# Patient Record
Sex: Female | Born: 1985 | Hispanic: No | Marital: Single | Smoking: Never smoker
Health system: Southern US, Community
[De-identification: ages and names within clinical notes are randomized; demographics above are authoritative.]

---

## 2019-09-23 ENCOUNTER — Other Ambulatory Visit: Payer: Self-pay

## 2019-09-23 ENCOUNTER — Emergency Department (HOSPITAL_COMMUNITY)
Admission: EM | Admit: 2019-09-23 | Discharge: 2019-09-23 | Disposition: A | Discharge status: Home / Self Care | Payer: Self-pay | Attending: Emergency Medicine | Admitting: Emergency Medicine

## 2019-09-23 ENCOUNTER — Emergency Department (HOSPITAL_COMMUNITY): Payer: Self-pay

## 2019-09-23 DIAGNOSIS — S60211A Contusion of right wrist, initial encounter: Secondary | ICD-10-CM | POA: Insufficient documentation

## 2019-09-23 DIAGNOSIS — S60212A Contusion of left wrist, initial encounter: Secondary | ICD-10-CM | POA: Insufficient documentation

## 2019-09-23 DIAGNOSIS — S60812A Abrasion of left wrist, initial encounter: Secondary | ICD-10-CM | POA: Insufficient documentation
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
## 2019-09-23 NOTE — Discharge Instructions (Signed)
Your x-rays were negative for fracture.  Rest ice and elevate both your wrist.  Use Tylenol and ibuprofen as discussed below. These will take some time to heal.  Please read the attached information.  Please use Tylenol or ibuprofen for pain.  You may use 600 mg ibuprofen every 6 hours or 1000 mg of Tylenol every 6 hours.  You may choose to alternate between the 2.  This would be most effective.  Not to exceed 4 g of Tylenol within 24 hours.  Not to exceed 3200 mg ibuprofen 24 hours.

## 2019-11-14 ENCOUNTER — Emergency Department (HOSPITAL_COMMUNITY)
Admission: EM | Admit: 2019-11-14 | Discharge: 2019-11-14 | Disposition: A | Discharge status: Home / Self Care | Payer: Self-pay | Attending: Emergency Medicine | Admitting: Emergency Medicine

## 2019-11-14 ENCOUNTER — Encounter (HOSPITAL_COMMUNITY): Payer: Self-pay | Admitting: Emergency Medicine

## 2019-11-14 ENCOUNTER — Other Ambulatory Visit: Payer: Self-pay

## 2019-11-14 DIAGNOSIS — K0889 Other specified disorders of teeth and supporting structures: Secondary | ICD-10-CM | POA: Insufficient documentation

## 2019-11-14 MED ORDER — AMOXICILLIN 500 MG PO CAPS
500.0000 mg | ORAL_CAPSULE | Freq: Once | ORAL | Status: AC
  Administered 2019-11-14: 500 mg via ORAL
  Filled 2019-11-14: qty 1

## 2019-11-14 MED ORDER — NAPROXEN 250 MG PO TABS
375.0000 mg | ORAL_TABLET | Freq: Once | ORAL | Status: AC
  Administered 2019-11-14: 375 mg via ORAL
  Filled 2019-11-14: qty 2

## 2019-11-14 MED ORDER — AMOXICILLIN 500 MG PO CAPS: 500.0000 mg | ORAL_CAPSULE | Freq: Two times a day (BID) | ORAL | 0 refills | Status: AC

## 2019-11-14 MED ORDER — AMOXICILLIN-POT CLAVULANATE 875-125 MG PO TABS: 1.0000 | ORAL_TABLET | Freq: Once | ORAL | Status: DC

## 2019-11-14 NOTE — ED Triage Notes (Signed)
C/o R upper and lower dental pain x 1 week.

## 2019-11-14 NOTE — ED Provider Notes (Signed)
[facility] EMERGENCY DEPARTMENT Provider Note   CSN: [id number] Arrival date & time: 11/14/19  1708     History Chief Complaint  Patient presents with  . Dental Pain    Christie Stevens is a 34 y.o. female who presents for evaluation of right-sided dental pain that has been ongoing for about a week.  She states that she has had a history of dental issues but does not currently have a dentist.  She feels like over the last week, the pain has started in both upper and lower part of her teeth.  She states that over the last day, is gotten worse.  She feels like the pain radiates up to her side.  She feels like her face might have been a little swollen.  She has not any fevers, nausea/vomiting, difficulty breathing.  She has been taking over-the-counter pain medications.  The history is provided by the patient.       History reviewed. No pertinent past medical history.  There are no problems to display for this patient.   History reviewed. No pertinent surgical history.   OB History   No obstetric history on file.     No family history on file.  Social History   Tobacco Use  . Smoking status: Never Smoker  . Smokeless tobacco: Never Used  Substance Use Topics  . Alcohol use: Not Currently  . Drug use: Not Currently    Home Medications Prior to Admission medications   Medication Sig Start Date End Date Taking? Authorizing Provider  amoxicillin (AMOXIL) 500 MG capsule Take 1 capsule (500 mg total) by mouth 2 (two) times daily for 7 days. 11/14/19 11/21/19  Maxwell Caul, PA-C    Allergies    Patient has no known allergies.  Review of Systems   Review of Systems  Constitutional: Negative for fever.  HENT: Positive for dental problem. Negative for trouble swallowing.   Respiratory: Negative for shortness of breath.   Gastrointestinal: Negative for vomiting.  All other systems reviewed and are negative.   Physical Exam Updated Vital  Signs BP 133/67 (BP Location: Right Arm)   Pulse 63   Temp 98.2 F (36.8 C) (Oral)   Resp 18   LMP 10/24/2019   SpO2 98%   Physical Exam Vitals and nursing note reviewed.  Constitutional:      Appearance: She is well-developed.  HENT:     Head: Normocephalic and atraumatic.     Comments: Face is symmetric in appearance without any overlying warmth, erythema, edema.    Mouth/Throat:     Dentition: Abnormal dentition.     Comments: Posterior oropharynx is clear without any signs of erythema, edema.  She has diffuse dental caries noted.  She has a partially cracked right upper molar at approximately tooth #2.  No surrounding gingival erythema, edema.  No identifiable dental abscess.  Uvula is midline.  Airways patent, phonation is intact. Eyes:     General: No scleral icterus.       Right eye: No discharge.        Left eye: No discharge.     Conjunctiva/sclera: Conjunctivae normal.  Pulmonary:     Effort: Pulmonary effort is normal.  Skin:    General: Skin is warm and dry.  Neurological:     Mental Status: She is alert.  Psychiatric:        Speech: Speech normal.        Behavior: Behavior normal.     ED  Results / Procedures / Treatments   Labs (all labs ordered are listed, but only abnormal results are displayed) Labs Reviewed - No data to display  EKG None  Radiology No results found.  Procedures Procedures (including critical care time)  Medications Ordered in ED Medications  amoxicillin (AMOXIL) capsule 500 mg (500 mg Oral Given 11/14/19 1900)  naproxen (NAPROSYN) tablet 375 mg (375 mg Oral Given 11/14/19 1900)    ED Course  I have reviewed the triage vital signs and the nursing notes.  Pertinent labs & imaging results that were available during my care of the patient were reviewed by me and considered in my medical decision making (see chart for details).    MDM Rules/Calculators/A&P                          34 y.o. F presents with 1 week of dental  pain. No evidence of abscess requiring immediate incision and drainage. Exam not concerning for Ludwig's angina or pharyngeal abscess. Will treat with amoxicillin. Patient instructed to follow-up with dentist referral provided. Stable for discharge at this time. Strict return precautions discussed. Patient expresses understanding and agreement to plan.    Portions of this note were generated with Scientist, clinical (histocompatibility and immunogenetics). Dictation errors may occur despite best attempts at proofreading.    Final Clinical Impression(s) / ED Diagnoses Final diagnoses:  Pain, dental    Rx / DC Orders ED Discharge Orders         Ordered    amoxicillin (AMOXIL) 500 MG capsule  2 times daily        11/14/19 1853           Maxwell Caul, PA-C 11/14/19 2058    Tegeler, Canary Brim, MD 11/14/19 2350

## 2019-11-14 NOTE — Discharge Instructions (Signed)
Take antibiotics as directed. Please take all of your antibiotics until finished.  You can take Tylenol or Ibuprofen as directed for pain. You can alternate Tylenol and Ibuprofen every 4 hours. If you take Tylenol at 1pm, then you can take Ibuprofen at 5pm. Then you can take Tylenol again at 9pm.   The exam and treatment you received today has been provided on an emergency basis only. This is not a substitute for complete medical or dental care. If your problem worsens or new symptoms (problems) appear, and you are unable to arrange prompt follow-up care with your dentist, call or return to this location. If you do not have a dentist, please follow-up with one on the list provided  CALL YOUR DENTIST OR RETURN IMMEDIATELY IF you develop a fever, rash, difficulty breathing or swallowing, neck or facial swelling, or other potentially serious concerns.   Please follow-up with one of the dental clinics provided to you below or in your paperwork. Call and tell them you were seen in the Emergency Dept and arrange for an appointment. You may have to call multiple places in order to find a place to be seen.  Dental Assistance If the dentist on-call cannot see you, please use the resources below:   Patients with Medicaid: [facility] [address], [phone], [phone]  If unable to pay, or uninsured, contact [facility] ([phone]) or [facility] ([phone] in [place], [phone] in [place]) to become qualified for the adult dental clinic  Other Low-Cost Community Dental Services: [facility]- [address], [place], [place], [address]    [phone], Ext. [phone]    2nd and 4th Thursday of the month at 6:30am    10 clients each day by appointment, can sometimes see walk-in     patients if someone does not show for an appointment [facility]- [address], [place], [place], [address]    [phone]  [facility]- [address], [place], [place], [address]    [phone]  [facility]- [phone] [facility]- [phone] [facility]- [phone]  

## 2019-11-14 NOTE — ED Notes (Signed)
Patient verbalizes understanding of discharge instructions. Opportunity for questioning and answers were provided. Arm band removed by staff, patient discharged from ED. 

## 2021-09-12 IMAGING — CR DG WRIST COMPLETE 3+V*L*
4 series · 4 of 4 positions shown · non-contrast
Comparison: None.

CLINICAL DATA: Bruising to both wrists

EXAM:
RIGHT WRIST - COMPLETE 3+ VIEW; LEFT WRIST - COMPLETE 3+ VIEW

[wrist pa]
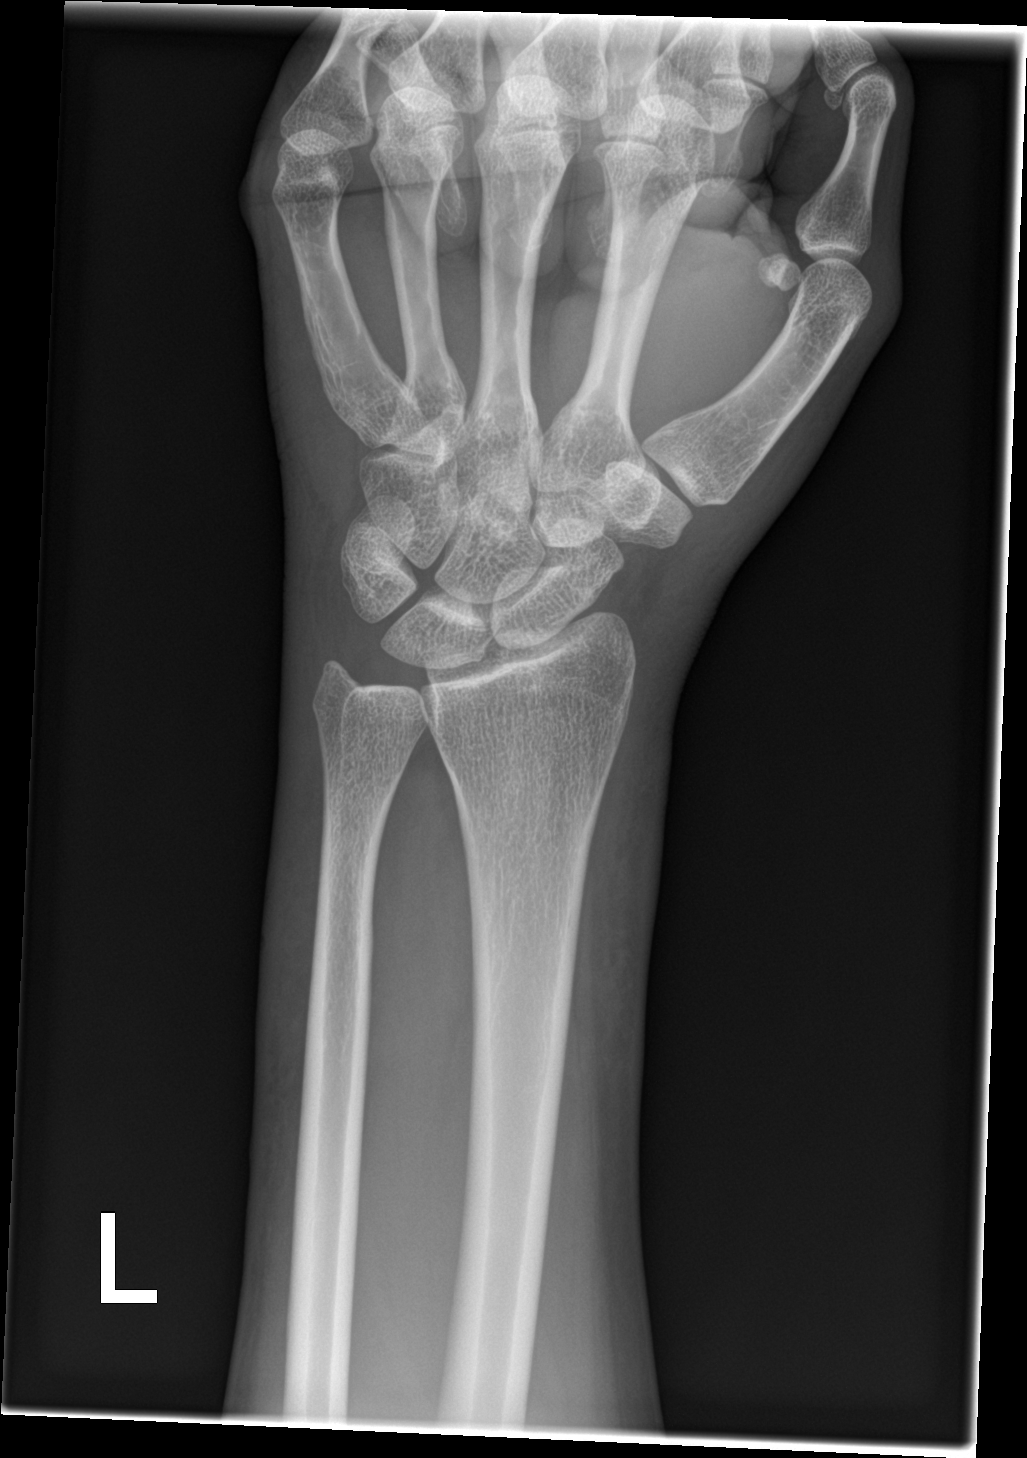

[wrist obl]
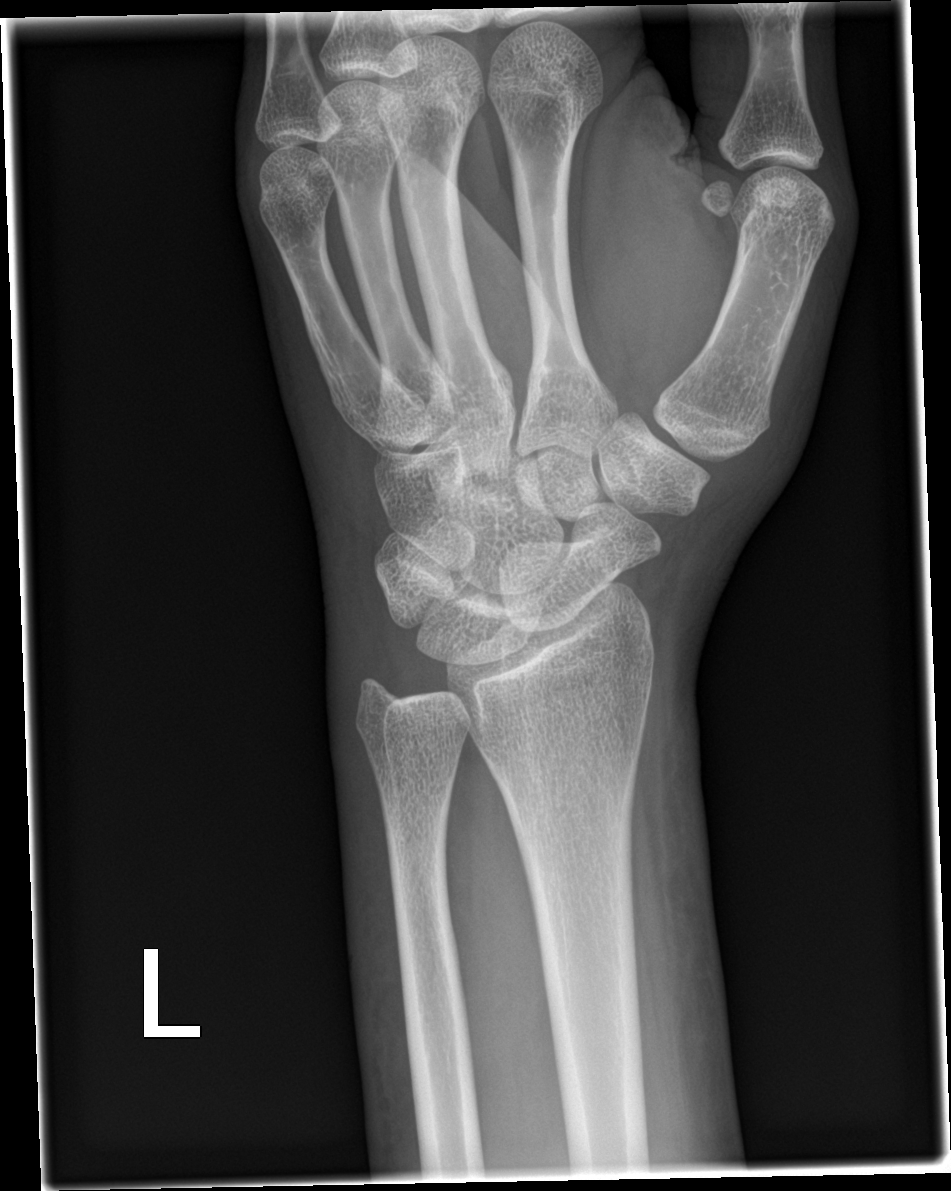

[wrist lat]
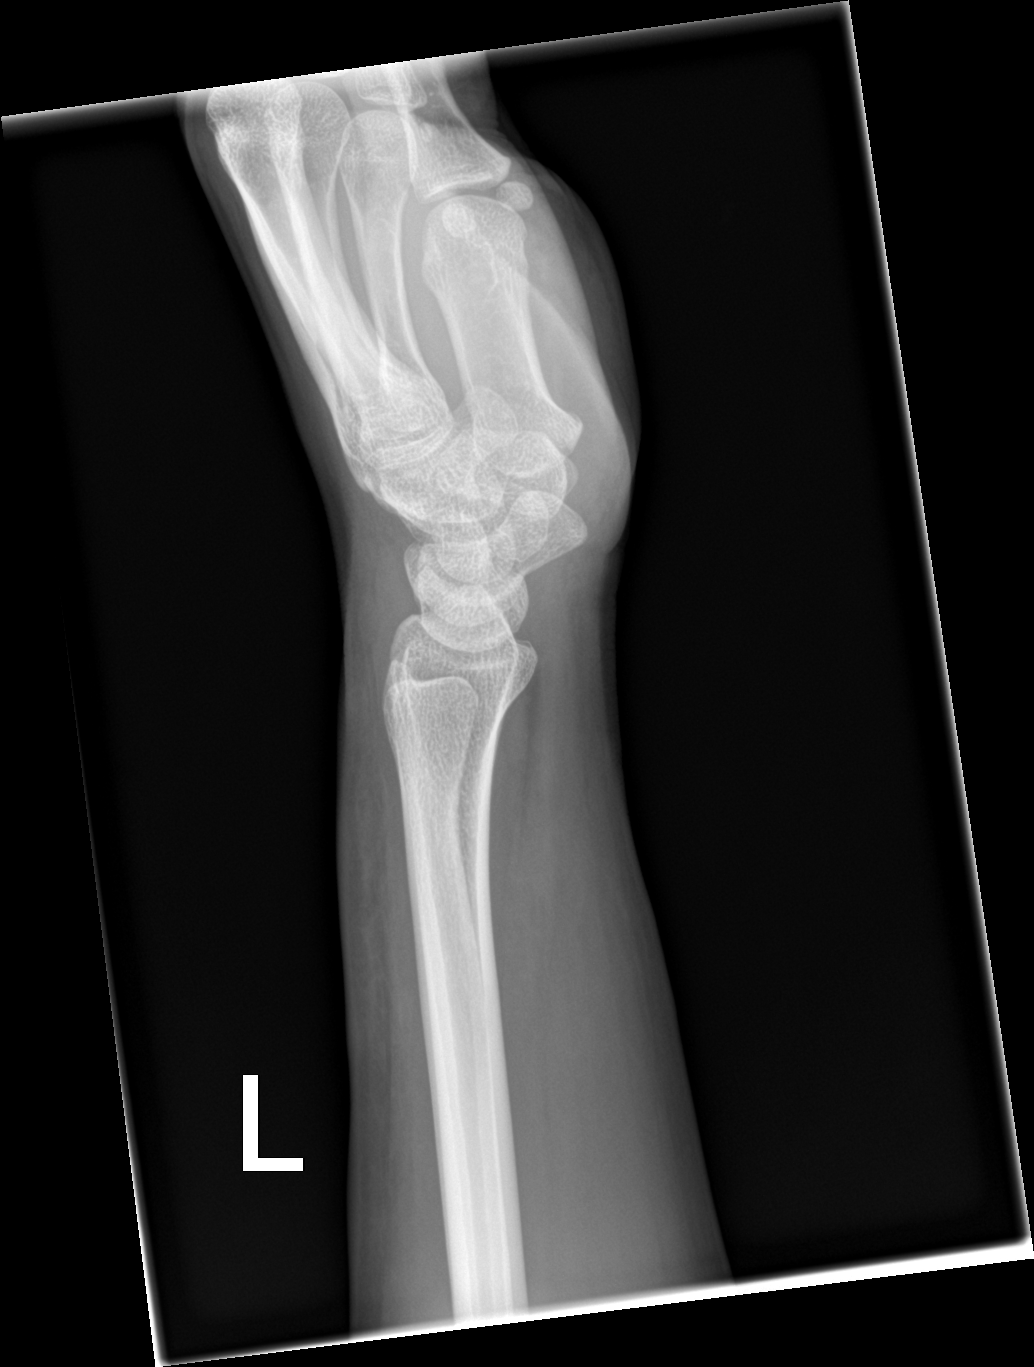

[wrist navicular]
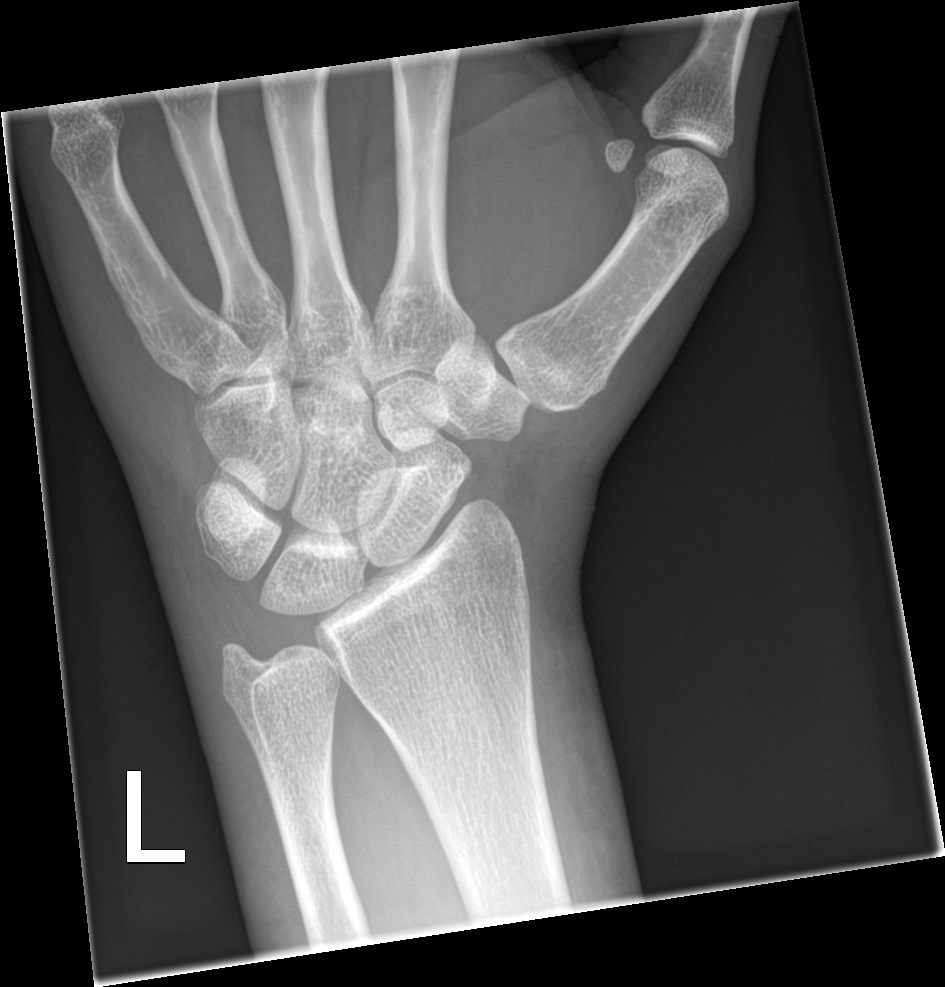

[4 of 4 positions shown; findings below may reference images not displayed]

FINDINGS: RIGHT wrist:

No acute fracture or dislocation. Joint spaces and alignment are
maintained. No area of erosion or osseous destruction. No unexpected
radiopaque foreign body. Soft tissues are unremarkable.

LEFT wrist:

No acute fracture or dislocation. Joint spaces and alignment are
maintained. No area of erosion or osseous destruction. No unexpected
radiopaque foreign body. Soft tissues are unremarkable.
IMPRESSION: No acute osseous abnormality in the bilateral wrists.

## 2021-09-12 IMAGING — CR DG WRIST COMPLETE 3+V*R*
4 series · 4 of 4 positions shown · non-contrast
Comparison: None.

CLINICAL DATA: Bruising to both wrists

EXAM:
RIGHT WRIST - COMPLETE 3+ VIEW; LEFT WRIST - COMPLETE 3+ VIEW

[wrist pa]
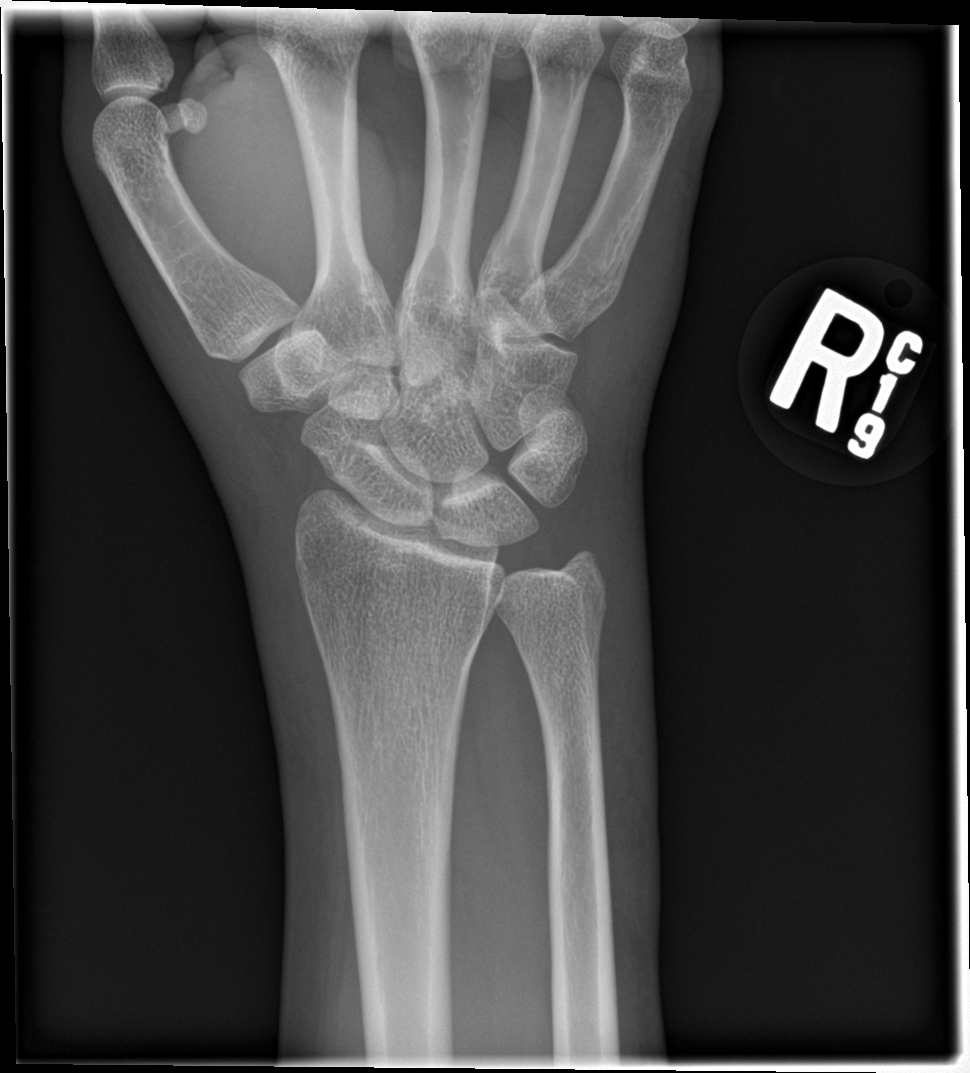

[wrist obl]
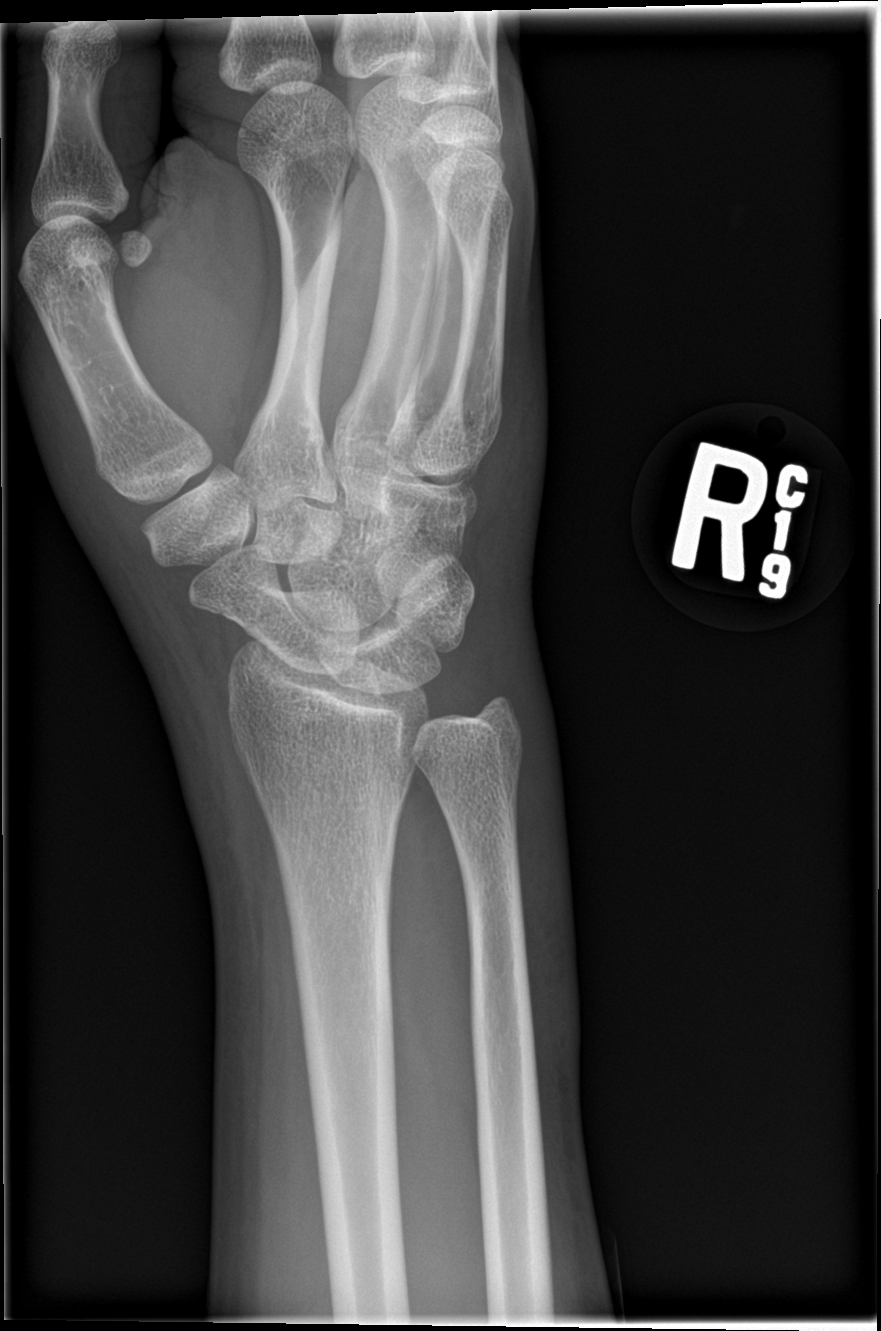

[wrist navicular]
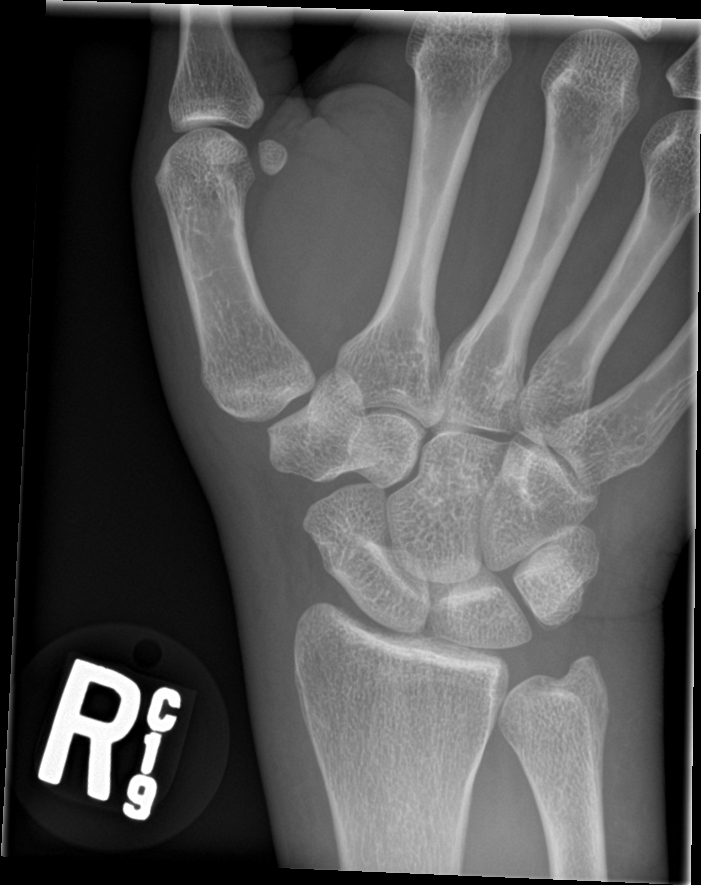

[wrist lat]
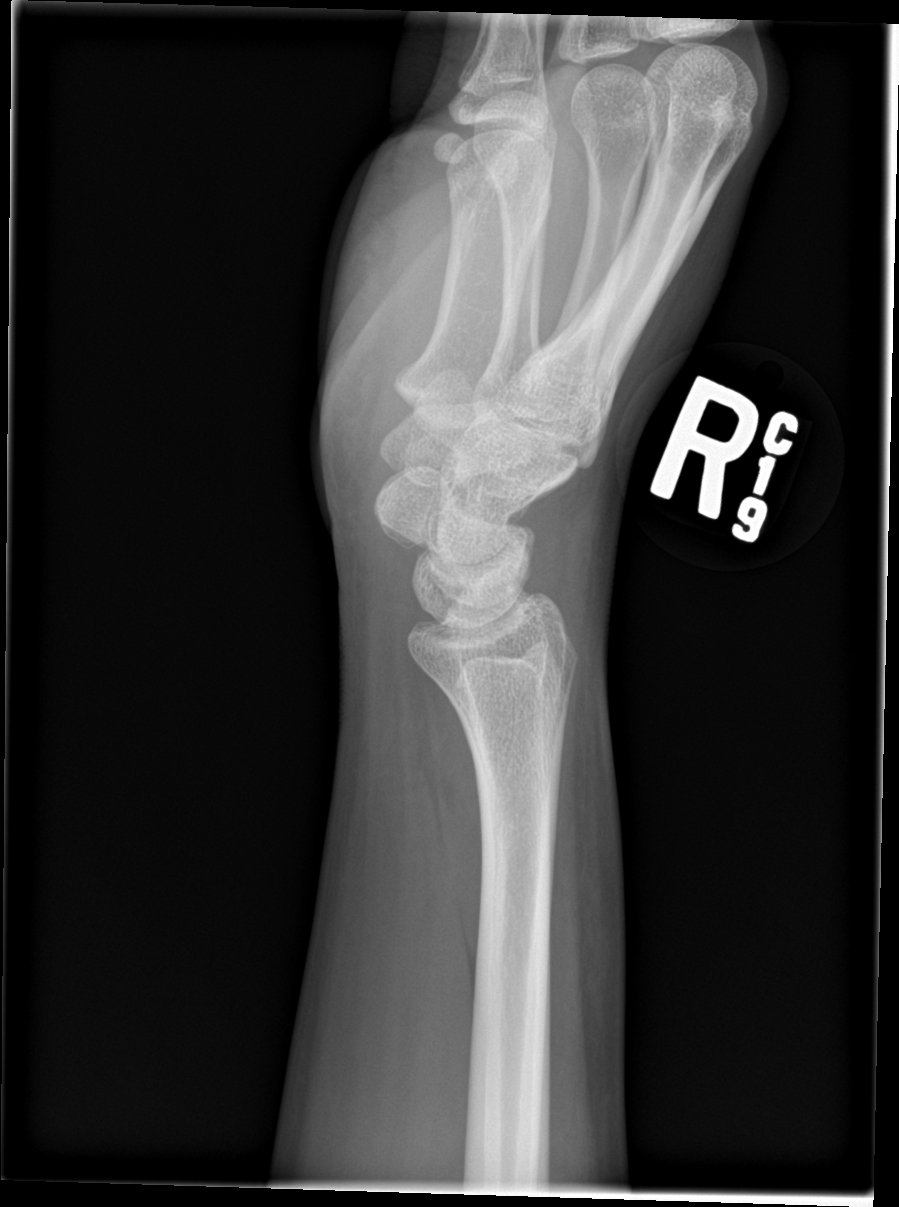

[4 of 4 positions shown; findings below may reference images not displayed]

FINDINGS: RIGHT wrist:

No acute fracture or dislocation. Joint spaces and alignment are
maintained. No area of erosion or osseous destruction. No unexpected
radiopaque foreign body. Soft tissues are unremarkable.

LEFT wrist:

No acute fracture or dislocation. Joint spaces and alignment are
maintained. No area of erosion or osseous destruction. No unexpected
radiopaque foreign body. Soft tissues are unremarkable.
IMPRESSION: No acute osseous abnormality in the bilateral wrists.
